# Patient Record
Sex: Female | Born: 1935 | Race: White | Hispanic: No | Smoking: Never smoker
Health system: Southern US, Community
[De-identification: ages and names within clinical notes are randomized; demographics above are authoritative.]

## PROBLEM LIST (undated history)

## (undated) DIAGNOSIS — R32 Unspecified urinary incontinence: Secondary | ICD-10-CM

## (undated) DIAGNOSIS — G20A1 Parkinson's disease without dyskinesia, without mention of fluctuations: Secondary | ICD-10-CM

## (undated) DIAGNOSIS — M2550 Pain in unspecified joint: Secondary | ICD-10-CM

## (undated) DIAGNOSIS — I10 Essential (primary) hypertension: Secondary | ICD-10-CM

## (undated) DIAGNOSIS — G8929 Other chronic pain: Secondary | ICD-10-CM

## (undated) DIAGNOSIS — D649 Anemia, unspecified: Secondary | ICD-10-CM

## (undated) DIAGNOSIS — E039 Hypothyroidism, unspecified: Secondary | ICD-10-CM

## (undated) DIAGNOSIS — M797 Fibromyalgia: Secondary | ICD-10-CM

## (undated) DIAGNOSIS — I639 Cerebral infarction, unspecified: Secondary | ICD-10-CM

## (undated) DIAGNOSIS — G20 Parkinson's disease: Secondary | ICD-10-CM

## (undated) DIAGNOSIS — R413 Other amnesia: Secondary | ICD-10-CM

## (undated) DIAGNOSIS — I2510 Atherosclerotic heart disease of native coronary artery without angina pectoris: Secondary | ICD-10-CM

## (undated) DIAGNOSIS — M1990 Unspecified osteoarthritis, unspecified site: Secondary | ICD-10-CM

## (undated) DIAGNOSIS — Z8719 Personal history of other diseases of the digestive system: Secondary | ICD-10-CM

## (undated) DIAGNOSIS — Z87440 Personal history of urinary (tract) infections: Secondary | ICD-10-CM

## (undated) HISTORY — PX: EYE SURGERY: SHX253

## (undated) HISTORY — PX: ABDOMINAL HYSTERECTOMY: SHX81

## (undated) HISTORY — PX: OTHER SURGICAL HISTORY: SHX169

## (undated) HISTORY — PX: JOINT REPLACEMENT: SHX530

---

## 1998-04-23 ENCOUNTER — Other Ambulatory Visit: Admission: RE | Admit: 1998-04-23 | Discharge: 1998-04-23 | Payer: Self-pay | Admitting: Otolaryngology

## 1998-06-26 ENCOUNTER — Inpatient Hospital Stay (HOSPITAL_COMMUNITY): Admission: RE | Admit: 1998-06-26 | Discharge: 1998-06-30 | Payer: Self-pay | Admitting: Orthopedic Surgery

## 1998-06-30 ENCOUNTER — Inpatient Hospital Stay (HOSPITAL_COMMUNITY)
Admission: RE | Admit: 1998-06-30 | Discharge: 1998-07-07 | Payer: Self-pay | Admitting: Physical Medicine and Rehabilitation

## 1998-12-02 ENCOUNTER — Inpatient Hospital Stay (HOSPITAL_COMMUNITY): Admission: RE | Admit: 1998-12-02 | Discharge: 1998-12-05 | Payer: Self-pay | Admitting: *Deleted

## 1998-12-03 ENCOUNTER — Encounter: Payer: Self-pay | Admitting: *Deleted

## 1998-12-04 ENCOUNTER — Encounter: Payer: Self-pay | Admitting: *Deleted

## 1999-06-28 ENCOUNTER — Observation Stay (HOSPITAL_COMMUNITY): Admission: RE | Admit: 1999-06-28 | Discharge: 1999-06-29 | Payer: Self-pay | Admitting: Urology

## 1999-08-26 ENCOUNTER — Ambulatory Visit (HOSPITAL_COMMUNITY): Admission: RE | Admit: 1999-08-26 | Discharge: 1999-08-26 | Payer: Self-pay | Admitting: *Deleted

## 2002-08-05 ENCOUNTER — Encounter: Payer: Self-pay | Admitting: Neurology

## 2002-08-05 ENCOUNTER — Ambulatory Visit (HOSPITAL_COMMUNITY): Admission: RE | Admit: 2002-08-05 | Discharge: 2002-08-05 | Payer: Self-pay | Admitting: Neurology

## 2003-03-25 ENCOUNTER — Encounter: Admission: RE | Admit: 2003-03-25 | Discharge: 2003-03-25 | Payer: Self-pay

## 2003-04-11 ENCOUNTER — Encounter (INDEPENDENT_AMBULATORY_CARE_PROVIDER_SITE_OTHER): Payer: Self-pay | Admitting: Specialist

## 2003-04-11 ENCOUNTER — Encounter: Payer: Self-pay | Admitting: *Deleted

## 2003-04-11 ENCOUNTER — Ambulatory Visit (HOSPITAL_COMMUNITY): Admission: RE | Admit: 2003-04-11 | Discharge: 2003-04-11 | Payer: Self-pay | Admitting: *Deleted

## 2003-04-14 ENCOUNTER — Encounter: Payer: Self-pay | Admitting: *Deleted

## 2003-04-14 ENCOUNTER — Ambulatory Visit (HOSPITAL_COMMUNITY): Admission: RE | Admit: 2003-04-14 | Discharge: 2003-04-14 | Payer: Self-pay | Admitting: *Deleted

## 2003-06-10 ENCOUNTER — Ambulatory Visit (HOSPITAL_COMMUNITY): Admission: RE | Admit: 2003-06-10 | Discharge: 2003-06-10 | Payer: Self-pay | Admitting: *Deleted

## 2003-06-10 ENCOUNTER — Encounter: Payer: Self-pay | Admitting: *Deleted

## 2005-06-07 ENCOUNTER — Inpatient Hospital Stay (HOSPITAL_COMMUNITY): Admission: RE | Admit: 2005-06-07 | Discharge: 2005-06-12 | Payer: Self-pay | Admitting: Neurosurgery

## 2006-05-25 ENCOUNTER — Encounter: Admission: RE | Admit: 2006-05-25 | Discharge: 2006-05-25 | Payer: Self-pay

## 2006-06-15 ENCOUNTER — Encounter: Admission: RE | Admit: 2006-06-15 | Discharge: 2006-06-15 | Payer: Self-pay

## 2006-07-06 ENCOUNTER — Encounter: Admission: RE | Admit: 2006-07-06 | Discharge: 2006-07-06 | Payer: Self-pay

## 2006-09-27 ENCOUNTER — Encounter
Admission: RE | Admit: 2006-09-27 | Discharge: 2006-09-27 | Payer: Self-pay | Admitting: Physical Medicine and Rehabilitation

## 2006-11-15 ENCOUNTER — Ambulatory Visit (HOSPITAL_COMMUNITY)
Admission: RE | Admit: 2006-11-15 | Discharge: 2006-11-15 | Payer: Self-pay | Admitting: Physical Medicine and Rehabilitation

## 2007-03-22 ENCOUNTER — Encounter
Admission: RE | Admit: 2007-03-22 | Discharge: 2007-03-22 | Payer: Self-pay | Admitting: Physical Medicine and Rehabilitation

## 2007-03-29 ENCOUNTER — Encounter
Admission: RE | Admit: 2007-03-29 | Discharge: 2007-03-29 | Payer: Self-pay | Admitting: Physical Medicine and Rehabilitation

## 2008-01-23 ENCOUNTER — Encounter
Admission: RE | Admit: 2008-01-23 | Discharge: 2008-01-23 | Payer: Self-pay | Admitting: Physical Medicine and Rehabilitation

## 2009-11-18 ENCOUNTER — Emergency Department (HOSPITAL_COMMUNITY): Admission: EM | Admit: 2009-11-18 | Discharge: 2009-11-18 | Payer: Self-pay | Admitting: Emergency Medicine

## 2010-03-10 IMAGING — CT CT HEAD W/O CM
1 series · 16 of 28 positions shown, 20 images · non-contrast
Comparison: None

CLINICAL DATA: Headache.

CT HEAD WITHOUT CONTRAST
TECHNIQUE: Contiguous axial images were obtained from the base of
the skull through the vertex without contrast.

[Series 2: brain · axial · 0.47mm/px · z∈[+150,+285]mm · 16 of 28 slices shown, 20 images]
[im 2/28  brain]
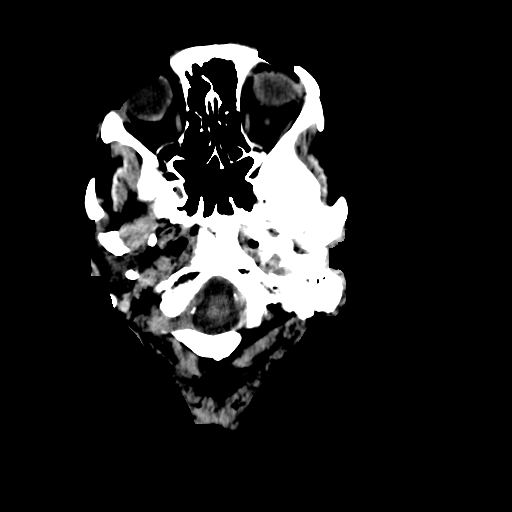
[im 2/28  bone]
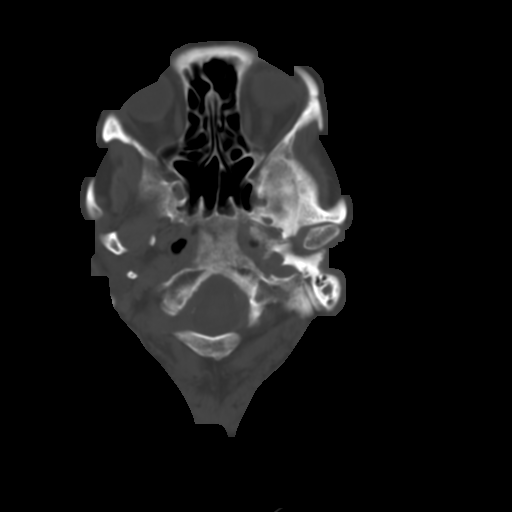
[im 4/28  brain]
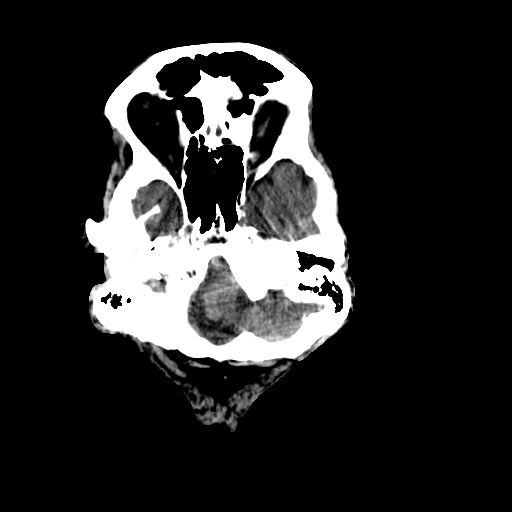
[im 6/28  brain]
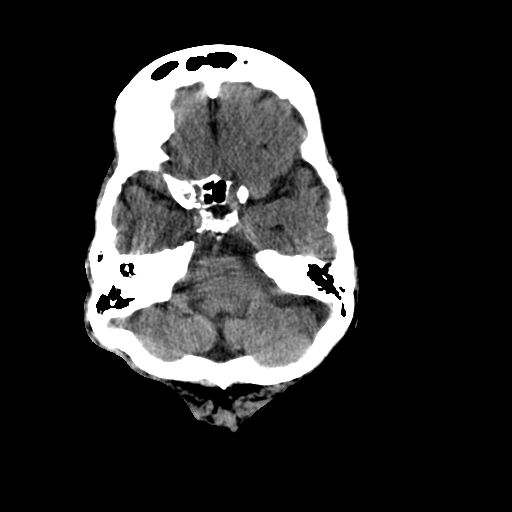
[im 7/28  brain]
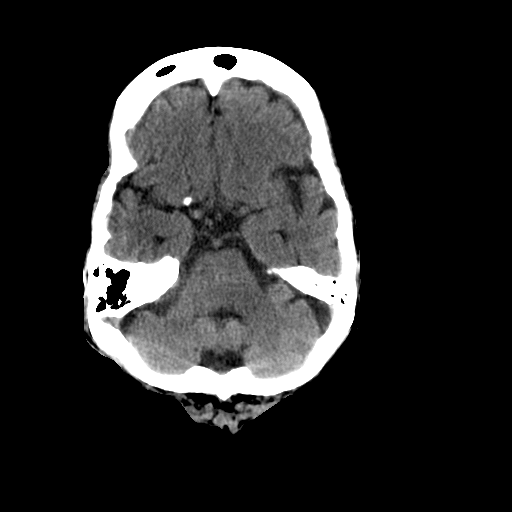
[im 9/28  brain]
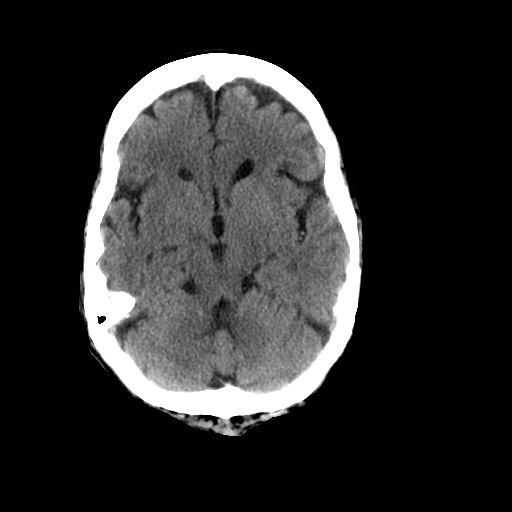
[im 9/28  bone]
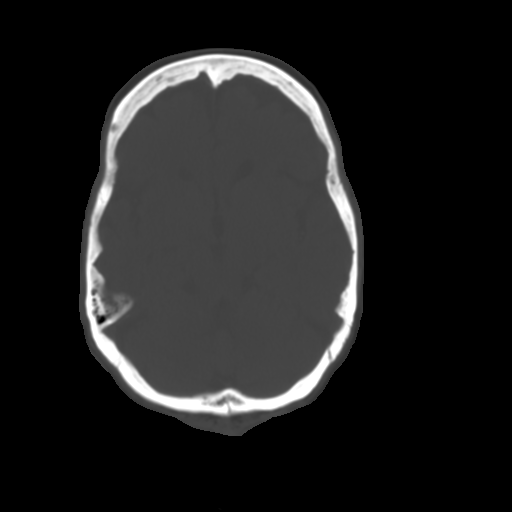
[im 10/28  brain]
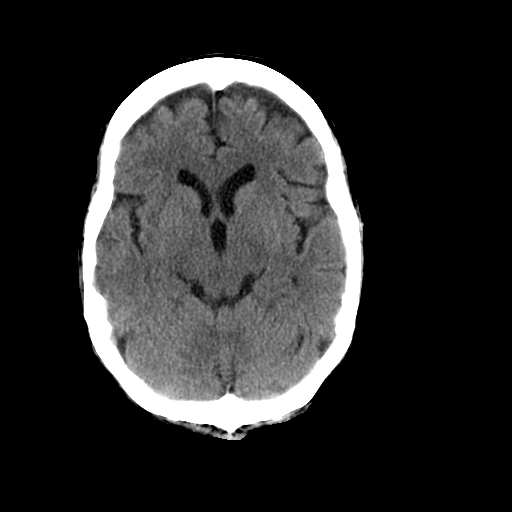
[im 12/28  brain]
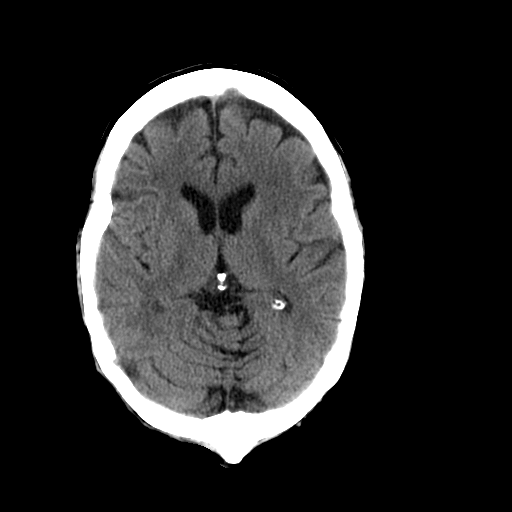
[im 14/28  brain]
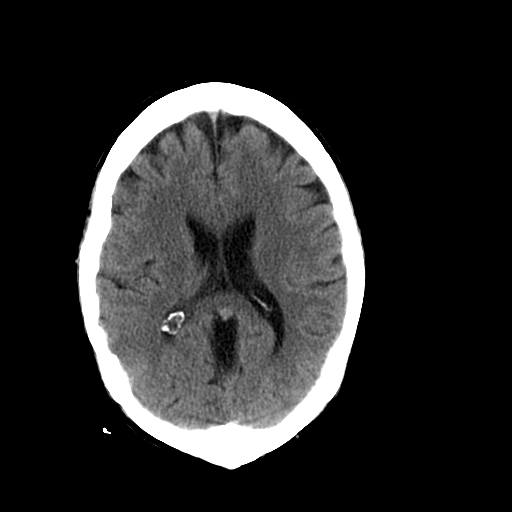
[im 15/28  brain]
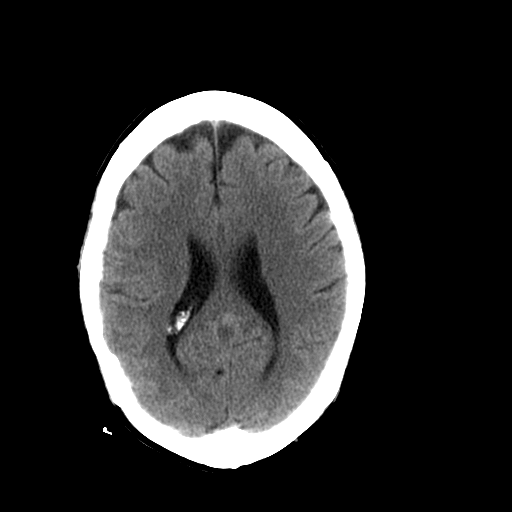
[im 15/28  bone]
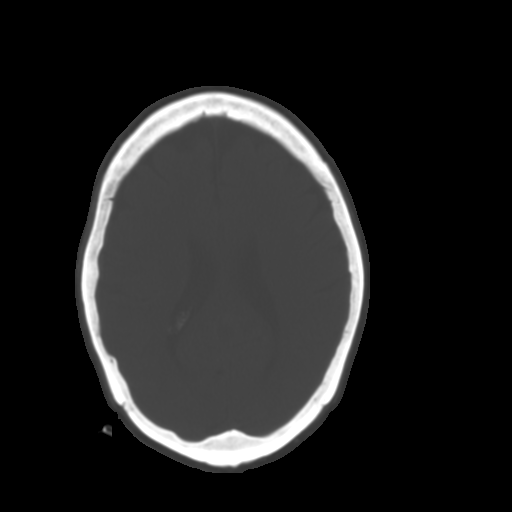
[im 17/28  brain]
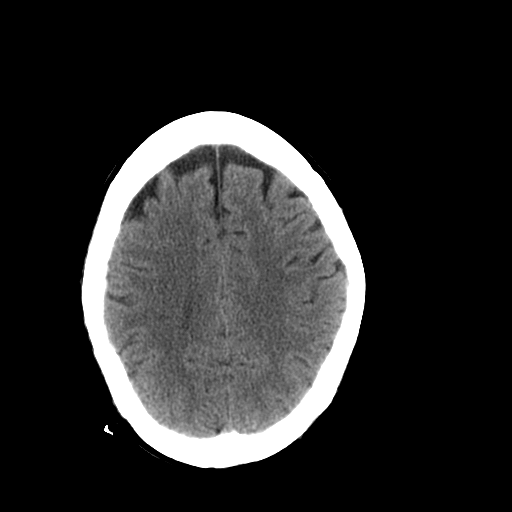
[im 19/28  brain]
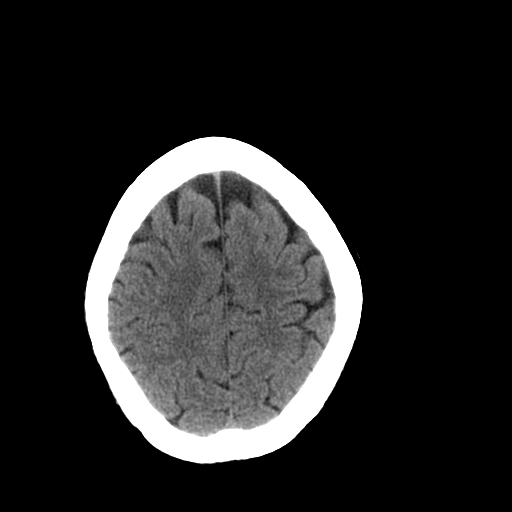
[im 20/28  brain]
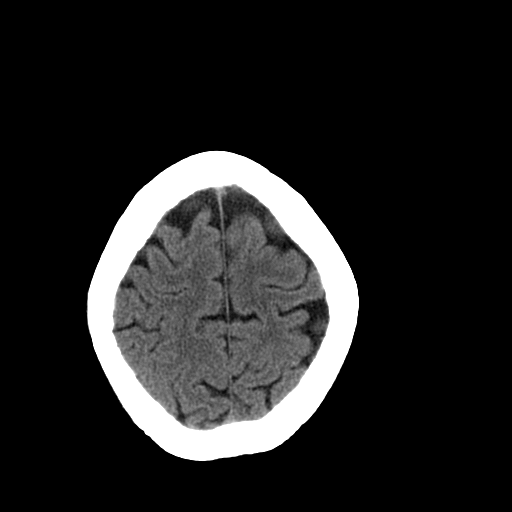
[im 22/28  brain]
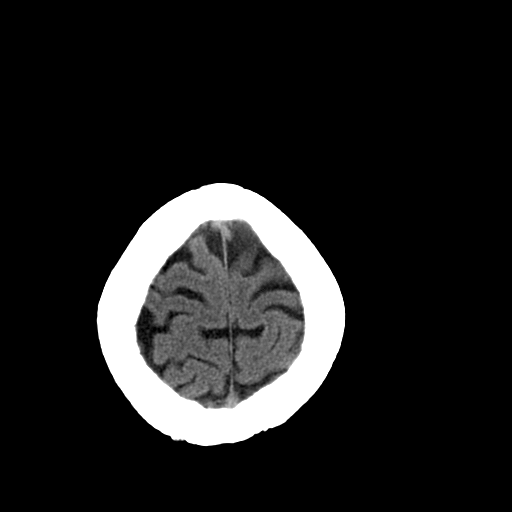
[im 22/28  bone]
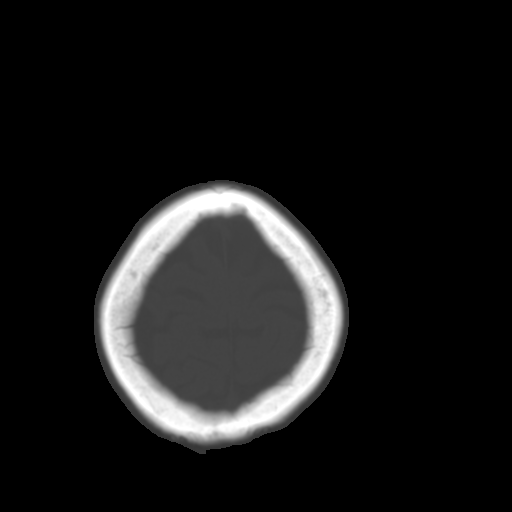
[im 23/28  brain]
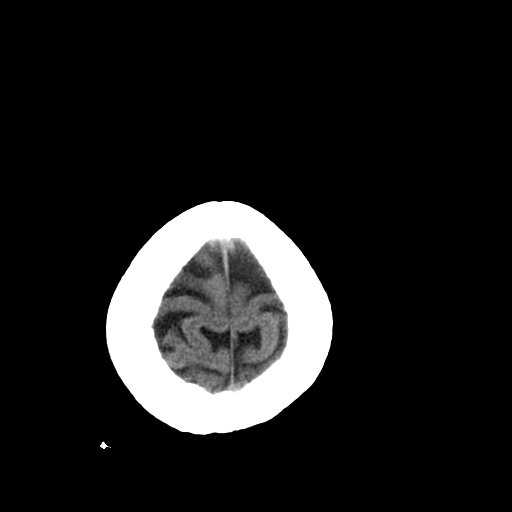
[im 25/28  brain]
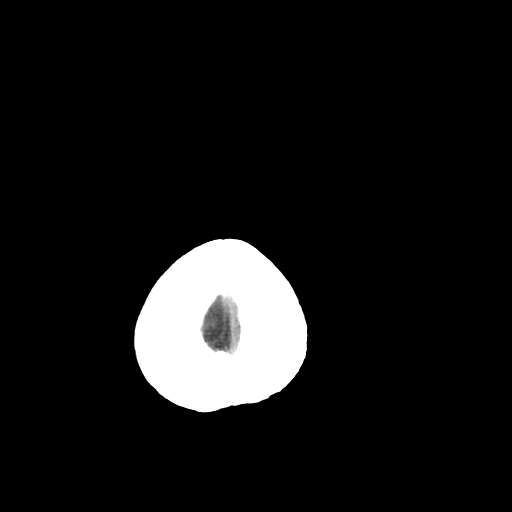
[im 27/28  brain]
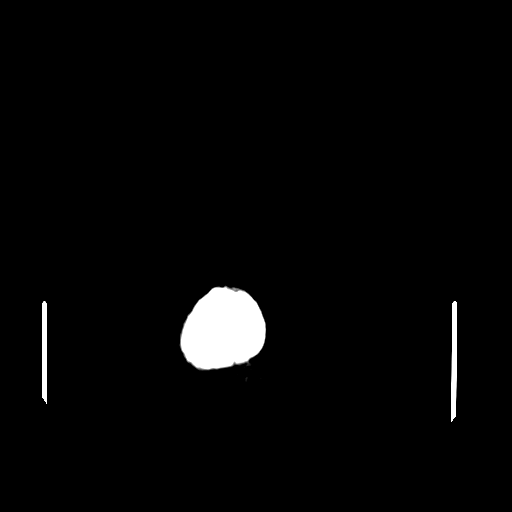

[16 of 28 positions shown; findings below may reference images not displayed]

FINDINGS: There is mild atrophy. No acute intracranial abnormality.
Specifically, no hemorrhage, hydrocephalus, mass lesion, acute
infarction, or significant intracranial injury.  No acute calvarial
abnormality.

Visualized paranasal sinuses and mastoids clear.  Orbital soft
tissues unremarkable.
IMPRESSION: No acute intracranial abnormality.

## 2010-12-12 ENCOUNTER — Encounter: Payer: Self-pay | Admitting: Physical Medicine and Rehabilitation

## 2011-02-21 LAB — URINE MICROSCOPIC-ADD ON

## 2011-02-21 LAB — COMPREHENSIVE METABOLIC PANEL
Alkaline Phosphatase: 59 U/L (ref 39–117)
BUN: 20 mg/dL (ref 6–23)
Chloride: 93 mEq/L — ABNORMAL LOW (ref 96–112)
Creatinine, Ser: 1.27 mg/dL — ABNORMAL HIGH (ref 0.4–1.2)
GFR calc non Af Amer: 41 mL/min — ABNORMAL LOW (ref >60)
Glucose, Bld: 127 mg/dL — ABNORMAL HIGH (ref 70–99)
Potassium: 3.5 mEq/L (ref 3.5–5.1)
Total Bilirubin: 0.5 mg/dL (ref 0.3–1.2)

## 2011-02-21 LAB — URINALYSIS, ROUTINE W REFLEX MICROSCOPIC
Glucose, UA: NEGATIVE mg/dL
Hgb urine dipstick: NEGATIVE
Specific Gravity, Urine: 1.011 (ref 1.005–1.030)
Urobilinogen, UA: 0.2 mg/dL (ref 0.0–1.0)

## 2011-02-21 LAB — CBC
HCT: 38.6 % (ref 36.0–46.0)
Hemoglobin: 13.1 g/dL (ref 12.0–15.0)
MCV: 102 fL — ABNORMAL HIGH (ref 78.0–100.0)
WBC: 9 10*3/uL (ref 4.0–10.5)

## 2011-02-21 LAB — URINE CULTURE

## 2011-02-21 LAB — DIFFERENTIAL
Basophils Absolute: 0 10*3/uL (ref 0.0–0.1)
Basophils Relative: 0 % (ref 0–1)
Lymphocytes Relative: 9 % — ABNORMAL LOW (ref 12–46)
Neutro Abs: 7.8 10*3/uL — ABNORMAL HIGH (ref 1.7–7.7)
Neutrophils Relative %: 87 % — ABNORMAL HIGH (ref 43–77)

## 2011-04-08 NOTE — Op Note (Signed)
   NAME:  Cristina Farrell, Cristina Farrell                           ACCOUNT NO.:  [id number]   MEDICAL RECORD NO.:  [id number]                   PATIENT TYPE:  AMB   LOCATION:  ENDO                                 FACILITY:  [facility]   PHYSICIAN:  Georgiana Spinner, M.D.                 DATE OF BIRTH:  1929/06/07   DATE OF PROCEDURE:  DATE OF DISCHARGE:                                 OPERATIVE REPORT   PROCEDURE:  Upper endoscopy.   INDICATIONS:  Weight loss, hemoccult positivity.   ANESTHESIA:  Demerol 60 mg, Versed 5 mg.   DESCRIPTION OF PROCEDURE:  With the patient mildly sedated in the left  lateral decubitus position, the Olympus videoscopic endoscope was inserted  in the mouth and passed under direct vision through the esophagus, which  appeared normal, into the stomach.  The  fundus, body and antrum were  visualized as was the duodenal bulb and second portion of the duodenum.  From this point, the endoscope was slowly withdrawn taking circumferential  views of the duodenal mucosa until the endoscope then pulled back into the  stomach, placed in retroflexion and viewed the stomach from below.  The  endoscope was then straightened, withdrawn, taking circumferential views of  the remaining gastric and esophageal mucosa, stopping only in the fundus and  body of the stomach to biopsy the erythema seen.  The patient's vital signs  and pulse oximetry remained stable.  The patient tolerated the procedure  well without apparent complication.   FINDINGS:  Changes of gastritis with blood flecks in the stomach, biopsied.  Await biopsy report.  The patient will call me for results and follow up  with me as an outpatient.  Proceed with colonoscopy as planned.                                                Georgiana Spinner, M.D.    GMO/MEDQ  D:  04/11/2003  T:  04/11/2003  Job:  [id number]

## 2011-04-08 NOTE — Op Note (Signed)
Cristina Farrell, Cristina Farrell NO.:  [id number]   MEDICAL RECORD NO.:  [id number]          PATIENT TYPE:  INP   LOCATION:  2899                         FACILITY:  [facility]   PHYSICIAN:  Hilda Lias, M.D.   DATE OF BIRTH:  03-Jul-1929   DATE OF PROCEDURE:  06/07/2005  DATE OF DISCHARGE:                                 OPERATIVE REPORT   PREOPERATIVE DIAGNOSIS:  Right L3-L4 extraforaminal herniated disc with  facet hypertrophy, stenosis, scoliosis, osteoporosis, chronic intake of  prednisone.   POSTOPERATIVE DIAGNOSIS:  Right L3-L4 extraforaminal herniated disc with  facet hypertrophy, stenosis, scoliosis, osteoporosis, chronic intake of  prednisone.   PROCEDURE:  Right L3-L4 laminotomy, foraminotomy to decompress the L3-L4  nerve root, right L3-L4 extraforaminal discectomy, decompression of the L3  nerve root, posterolateral arthrodesis with autograft and BMP on the left  side from L2 to L4, spiral plate from lamina of L2 to L4, microscope.   SURGEON:  Hilda Lias, M.D.   ASSISTANT:  Hewitt Shorts, M.D.   CLINICAL HISTORY:  The patient is being admitted because of back pain  radiating to the right leg.  The pain goes to the right knee but not below  the knee.  The patient has a severe case of osteoporosis and scoliosis.  MRI  showed herniated disc plus hypertrophy of the facet at the level of 3-4.  Surgery was advised.  The patient knew about using the plate as well as the  interbody fusion.  The risks were explained during the history and physical.   PROCEDURE:  The patient was taken to the OR and she was positioned in a  prone manner.  The back was sprayed with Betadine.  A midline incision from  the lower part of L2 down to the upper part of L4 was made.  The muscles  were retracted laterally and bilateral.  With the x-ray, we identified that,  indeed, we were at the level of 3-4.  Then, we brought the microscope into  the area and with the drill, we  drilled the lamina of L3 and L4.  Then, we  identified the L3-L4 extraforaminal space and with the drill, we drilled  part of the overgrowth of facet until we found the L3 nerve root which was  swollen and there was a disc not only anteriorly but also dorsally.  Decompression of the L3 nerve root was done.  We entered the disc space and  it was quite narrow.  Trying to go from the intraforaminal to foraminal, it  was difficult to go beyond the first 0.5 cm from the disc.  Because of that,  we decided not to go ahead with interbody fusion.  Having good decompression  of the L3-L4 nerve root, we went to the left side and we drilled the lamina  of the facet of L5, L3, and L4, and we used a mix of autograft as well as  BNP.  Then, a spiral plate from L2 to L4 to attach to the spinous process  was used.  At the end, we had a good, solid  fusion.  From then on, the area  was irrigated.  Fentanyl was left in the epidural space.  The wound was  closed with Vicryl and Steri-Strips.       EB/MEDQ  D:  06/07/2005  T:  06/07/2005  Job:  [id number]

## 2011-04-08 NOTE — Op Note (Signed)
   NAME:  Cristina Farrell, Cristina Farrell                           ACCOUNT NO.:  [id number]   MEDICAL RECORD NO.:  [id number]                   PATIENT TYPE:  AMB   LOCATION:  ENDO                                 [facility]AC[facility]   PHYSICIAN:  Georgiana Spinner, M.D.                 DATE OF BIRTH:  [birth year]   DATE OF PROCEDURE:  DATE OF DISCHARGE:                                 OPERATIVE REPORT   PROCEDURE:  Colonoscopy.   INDICATIONS:  Weight loss, hemoccult positivity which may be due to the  blood seen in the stomach.   ANESTHESIA:  Demerol 30, Versed 3 mg additionally.   DESCRIPTION OF PROCEDURE:  With the patient mildly sedated and in the left  lateral decubitus position, the Olympus videoscopic variable stiffness  colonoscope was inserted in the rectum and passed under direct vision to the  right colon.  It appeared we were in the ascending colon just above the  cecum but with pressure applied and the patient rolled to various positions,  we got to a position where the endoscope would not advance any further.  Therefore, the endoscope was withdrawn taking circumferential views of the  remaining colonic mucosa stopping then only in the rectum which appeared  normal and showed small hemorrhoids on retroflexed view.  The endoscope was  straightened and withdrawn.  The patient's vital signs and pulse oximetry  remained stable.  The patient tolerated the procedure well without apparent  complications.   FINDINGS:  Limited examination due to prep.  There were multiple areas of  brownish, thick liquid that had to be cleared.  Additionally we could not  reach the cecum because of tortuosity and length of the colon.   PLAN:  Air contrast barium enema and followup will be with me subsequently  as an outpatient.  See endoscopy note for further details as well.                                               Georgiana Spinner, M.D.    GMO/MEDQ  D:  04/11/2003  T:  04/11/2003  Job:  [id number]

## 2011-04-08 NOTE — Discharge Summary (Signed)
NAME:  Cristina Farrell, Cristina Farrell NO.:  [id number]   MEDICAL RECORD NO.:  [id number]          PATIENT TYPE:  INP   LOCATION:  3003                         FACILITY:  [facility]   PHYSICIAN:  Hewitt Shorts, M.D.DATE OF BIRTH:  [birth year]   DATE OF ADMISSION:  06/07/2005  DATE OF DISCHARGE:  06/12/2005                                 DISCHARGE SUMMARY   ADMISSION HISTORY AND PHYSICAL EXAMINATION:  The patient is a 77 year old  woman under the care of Dr. Jeral Fruit, who has had back and leg pain.  Dr.  Jeral Fruit described that her MRI showed evidence of lumbar stenosis and  admitted her for surgical decompression.  Details of his admission history  and physical examination are  included in his admission notes.   HOSPITAL COURSE:  The patient was admitted and underwent a right L3-4  decompression and arthrodesis.  She has done well following surgery.  She  was seen in consultation by physical therapy, but at this point is up and  ambulating actively in the halls.  Her wound is healing well. She is  afebrile and is asking to be discharged home.  We have gone ahead and given  her instructions for activities and wound care. She is to return to see Dr.  Jeral Fruit in three weeks for follow-up with AP lateral lumbar spine x-ray. She  is given prescription for Percocet 1-2 tablets p.o. q.4-6h. p.r.n. pain (40  tablets no refills).   DISCHARGE DIAGNOSIS:  1.  Lumbar spondylosis.  2.  Degenerative disk disease with stenosis.       RWN/MEDQ  D:  06/12/2005  T:  06/12/2005  Job:  [id number]

## 2014-10-21 DIAGNOSIS — Z87440 Personal history of urinary (tract) infections: Secondary | ICD-10-CM

## 2014-10-21 HISTORY — DX: Personal history of urinary (tract) infections: Z87.440

## 2015-01-09 ENCOUNTER — Other Ambulatory Visit: Payer: Self-pay | Admitting: Urology

## 2015-01-23 ENCOUNTER — Encounter (HOSPITAL_COMMUNITY): Payer: Self-pay

## 2015-01-23 ENCOUNTER — Other Ambulatory Visit: Payer: Self-pay

## 2015-01-23 ENCOUNTER — Encounter (HOSPITAL_COMMUNITY)
Admission: RE | Admit: 2015-01-23 | Discharge: 2015-01-23 | Disposition: A | Discharge status: Home / Self Care | Payer: Medicare Other | Source: Ambulatory Visit | Attending: Urology | Admitting: Urology

## 2015-01-23 DIAGNOSIS — Y838 Other surgical procedures as the cause of abnormal reaction of the patient, or of later complication, without mention of misadventure at the time of the procedure: Secondary | ICD-10-CM | POA: Insufficient documentation

## 2015-01-23 DIAGNOSIS — Z01810 Encounter for preprocedural cardiovascular examination: Secondary | ICD-10-CM | POA: Insufficient documentation

## 2015-01-23 DIAGNOSIS — Z01818 Encounter for other preprocedural examination: Secondary | ICD-10-CM | POA: Diagnosis not present

## 2015-01-23 DIAGNOSIS — T8589XA Other specified complication of internal prosthetic devices, implants and grafts, not elsewhere classified, initial encounter: Secondary | ICD-10-CM | POA: Diagnosis not present

## 2015-01-23 DIAGNOSIS — Z01812 Encounter for preprocedural laboratory examination: Secondary | ICD-10-CM | POA: Insufficient documentation

## 2015-01-23 HISTORY — DX: Unspecified osteoarthritis, unspecified site: M19.90

## 2015-01-23 HISTORY — DX: Personal history of other diseases of the digestive system: Z87.19

## 2015-01-23 HISTORY — DX: Essential (primary) hypertension: I10

## 2015-01-23 HISTORY — DX: Cerebral infarction, unspecified: I63.9

## 2015-01-23 HISTORY — DX: Anemia, unspecified: D64.9

## 2015-01-23 HISTORY — DX: Personal history of urinary (tract) infections: Z87.440

## 2015-01-23 HISTORY — DX: Atherosclerotic heart disease of native coronary artery without angina pectoris: I25.10

## 2015-01-23 HISTORY — DX: Other chronic pain: G89.29

## 2015-01-23 HISTORY — DX: Hypothyroidism, unspecified: E03.9

## 2015-01-23 HISTORY — DX: Other amnesia: R41.3

## 2015-01-23 HISTORY — DX: Fibromyalgia: M79.7

## 2015-01-23 HISTORY — DX: Pain in unspecified joint: M25.50

## 2015-01-23 HISTORY — DX: Parkinson's disease: G20

## 2015-01-23 HISTORY — DX: Unspecified urinary incontinence: R32

## 2015-01-23 HISTORY — DX: Parkinson's disease without dyskinesia, without mention of fluctuations: G20.A1

## 2015-01-23 LAB — CBC
HCT: 34.1 % — ABNORMAL LOW (ref 36.0–46.0)
Hemoglobin: 10.2 g/dL — ABNORMAL LOW (ref 12.0–15.0)
MCH: 30 pg (ref 26.0–34.0)
MCHC: 29.9 g/dL — ABNORMAL LOW (ref 30.0–36.0)
MCV: 100.3 fL — ABNORMAL HIGH (ref 78.0–100.0)
Platelets: 387 10*3/uL (ref 150–400)
RBC: 3.4 MIL/uL — AB (ref 3.87–5.11)
RDW: 14.8 % (ref 11.5–15.5)
WBC: 13.8 10*3/uL — ABNORMAL HIGH (ref 4.0–10.5)

## 2015-01-23 LAB — BASIC METABOLIC PANEL
Anion gap: 10 (ref 5–15)
BUN: 15 mg/dL (ref 6–23)
CALCIUM: 8.7 mg/dL (ref 8.4–10.5)
CO2: 32 mmol/L (ref 19–32)
CREATININE: 1.01 mg/dL (ref 0.50–1.10)
Chloride: 98 mmol/L (ref 96–112)
GFR calc Af Amer: 58 mL/min — ABNORMAL LOW (ref >90)
GFR calc non Af Amer: 50 mL/min — ABNORMAL LOW (ref >90)
GLUCOSE: 241 mg/dL — AB (ref 70–99)
Potassium: 4.5 mmol/L (ref 3.5–5.1)
Sodium: 140 mmol/L (ref 135–145)

## 2015-01-23 NOTE — Progress Notes (Signed)
Called office of Dr Ardelle [phone]- [phone].  They have no old EKG on file.  They do have ECHO- 2012 which they will fax along with office visit note  Close to approximate date of ECHO.  They will fax that also.  They do not have on file who prior PCP was.  Called and left messages for daughter- Boris Sharpernna Holler on home and cell phones asking who PCP was prior to Dr Ardelle ParkHaque to obtain old EKG if possible.  [facility] [place]- [phone] to find out who PCP was prior to Dr Ardelle ParkHaque to obtain EKG.

## 2015-01-23 NOTE — Patient Instructions (Addendum)
Cristina RecordsFay R Farrell  01/23/2015   Your procedure is scheduled on: Monday February 02, 2015  [facility] and follow signs to               Short Stay Center at 900 am.  Call this number if you have problems the morning of surgery [phone]   Remember: PLEASE SEND LATEST COPY OF MEDICATION RECORD AND LAST TIME PATIENT ATE AND DRANK WITH PATIENT DAY OF SURGERY!  Do not eat food or drink liquids :After Midnight.     Take these medicines the morning of surgery with A SIP OF WATER: Amlodipine, Artificial tears, Duloxetine, Levothryroxine, Lidocaine patch, Metorpolol Succ ER, Omeprazole, Prednisone                               You may not have any metal on your body including hair pins and              piercings  Do not wear jewelry, make-up, lotions, powders or perfumes.             Do not wear nail polish.  Do not shave  48 hours prior to surgery.              Men may shave face and neck.   Do not bring valuables to the hospital. [facility] IS NOT             RESPONSIBLE   FOR VALUABLES.  Contacts, dentures or bridgework may not be worn into surgery.  Leave suitcase in the car. After surgery it may be brought to your room.     Patients discharged the day of surgery will not be allowed to drive home.  Name and phone number of your driver:  Special Instructions: N/A              Please read over the following fact sheets you were given: _____________________________________________________________________             [facility] - Preparing for Surgery Before surgery, you can play an important role.  Because skin is not sterile, your skin needs to be as free of germs as possible.  You can reduce the number of germs on your skin by washing with CHG (chlorahexidine gluconate) soap before surgery.  CHG is an antiseptic cleaner which kills germs and bonds with the skin to continue killing germs even after washing. Please DO NOT use if you have an  allergy to CHG or antibacterial soaps.  If your skin becomes reddened/irritated stop using the CHG and inform your nurse when you arrive at Short Stay. Do not shave (including legs and underarms) for at least 48 hours prior to the first CHG shower.  You may shave your face/neck. Please follow these instructions carefully:  1.  Shower with CHG Soap the night before surgery and the  morning of Surgery.  2.  If you choose to wash your hair, wash your hair first as usual with your  normal  shampoo.  3.  After you shampoo, rinse your hair and body thoroughly to remove the  shampoo.                           4.  Use CHG as you would any other liquid soap.  You can  apply chg directly  to the skin and wash                       Gently with a scrungie or clean washcloth.  5.  Apply the CHG Soap to your body ONLY FROM THE NECK DOWN.   Do not use on face/ open                           Wound or open sores. Avoid contact with eyes, ears mouth and genitals (private parts).                       Wash face,  Genitals (private parts) with your normal soap.             6.  Wash thoroughly, paying special attention to the area where your surgery  will be performed.  7.  Thoroughly rinse your body with warm water from the neck down.  8.  DO NOT shower/wash with your normal soap after using and rinsing off  the CHG Soap.                9.  Pat yourself dry with a clean towel.            10.  Wear clean pajamas.            11.  Place clean sheets on your bed the night of your first shower and do not  sleep with pets. Day of Surgery : Do not apply any lotions/deodorants the morning of surgery.  Please wear clean clothes to the hospital/surgery center.  FAILURE TO FOLLOW THESE INSTRUCTIONS MAY RESULT IN THE CANCELLATION OF YOUR SURGERY PATIENT SIGNATURE_________________________________  NURSE SIGNATURE__________________________________  ________________________________________________________________________

## 2015-01-23 NOTE — Progress Notes (Signed)
Daughter returned call and stated prior PCP to Dr Ardelle ParkHaque was Dr [place].  [place] [facility] [place] and asked them to fax most recent EKG available.  Faxed to them records request.  Received back from them EKG from 2011.  Also received ECHO and office visit note of 2012 from Dr Ardelle ParkHaque.  All info of current EKG done 01/23/15 , EKG from 2011, ECHO and office visit note shown to Dr  GroutPeter Carignan. No new orders given.

## 2015-01-23 NOTE — Progress Notes (Addendum)
CALLED [facility] AND LEFT MESSAGE IN MED ROOM PRE OP INSTRUCTIONS FAXED TO [phone] AND COPY SENT WITH PATIENT DAUGHTER ANNA HOLLERS BACK TO FACILITY TODAY. CALL [phone] IF QUESTION.

## 2015-02-01 NOTE — H&P (Signed)
Active Problems Problems  1. Vaginal bleeding (N93.9)  History of Present Illness Cristina Farrell is and 81 yo female, resident of [facility] in [place], now 20+ years post pubovaginal sling. She has developed vaginal bleeding, and has been found by Dr. Leota Jacobsen to have vaginal suture erosion, and referred for Rx. she is c/o suprapubic pain.. Normal bowel movements. urine control: sometimes she has incontinence without awareness.   Past Medical History Problems  1. History of Dementia (F03.90) 2. History of arthritis (Z87.39) 3. History of esophageal reflux (Z87.19) 4. History of fibromyalgia (Z87.39) 5. History of glaucoma (Z86.69) 6. History of hypertension (Z86.79) 7. History of hypothyroidism (Z86.39) 8. History of Parkinson disease, symptomatic (G20)  Surgical History Problems  1. History of Ankle Surgery 2. History of Hernia Repair 3. History of Knee Replacement 4. History of Retropubic Urethral Suspension 5. History of Total Abdominal Hysterectomy 6. History of Total Hip Replacement  Current Meds 1. Amitriptyline HCl - 75 MG Oral Tablet;  Therapy: (Recorded:16Feb2016) to Recorded 2. AmLODIPine Besylate 5 MG Oral Tablet;  Therapy: (Recorded:16Feb2016) to Recorded 3. Artificial Tears SOLN;  Therapy: (Recorded:16Feb2016) to Recorded 4. Aspirin 325 MG Oral Tablet;  Therapy: (Recorded:16Feb2016) to Recorded 5. Atorvastatin Calcium 10 MG Oral Tablet;  Therapy: (Recorded:16Feb2016) to Recorded 6. DiphenhydrAMINE HCl - 25 MG Oral Capsule; TAKE CAPSULE  PRN;  Therapy: (Recorded:16Feb2016) to Recorded 7. Docusate Sodium 100 MG Oral Tablet;  Therapy: (Recorded:16Feb2016) to Recorded 8. DULoxetine HCl - 60 MG Oral Capsule Delayed Release Particles;  Therapy: (Recorded:16Feb2016) to Recorded 9. Ferralet 90 TABS;  Therapy: (Recorded:16Feb2016) to Recorded 10. Imodium A-D 2 MG Oral Tablet; TAKE TABLET  PRN;   Therapy: (Recorded:16Feb2016) to Recorded 11.  Ketorolac Tromethamine 10 MG Oral Tablet; TAKE TABLET  PRN;   Therapy: (Recorded:16Feb2016) to Recorded 12. Levothyroxine Sodium 112 MCG Oral Tablet;   Therapy: (Recorded:16Feb2016) to Recorded 13. Lidocaine 5 % External Patch;   Therapy: (Recorded:16Feb2016) to Recorded 14. Lisinopril 5 MG Oral Tablet;   Therapy: (Recorded:16Feb2016) to Recorded 15. Magnesium Oxide 400 MG Oral Tablet;   Therapy: (Recorded:16Feb2016) to Recorded 16. MetFORMIN HCl - 500 MG Oral Tablet;   Therapy: (Recorded:16Feb2016) to Recorded 17. Metoprolol Succinate ER 50 MG Oral Tablet Extended Release 24 Hour;   Therapy: (Recorded:16Feb2016) to Recorded 18. Multi-Vitamin TABS;   Therapy: (Recorded:16Feb2016) to Recorded 19. Nitrostat 0.4 MG Sublingual Tablet Sublingual; PLACE TABLET  PRN;   Therapy: (Recorded:16Feb2016) to Recorded 20. Omeprazole 40 MG Oral Capsule Delayed Release;   Therapy: (Recorded:16Feb2016) to Recorded 21. OxyCODONE HCl - 5 MG Oral Tablet;   Therapy: (Recorded:16Feb2016) to Recorded 22. Oyster Shell Calcium 500 MG Oral Tablet;   Therapy: (Recorded:16Feb2016) to Recorded 23. Polyethylene Glycol 3350 Oral Powder;   Therapy: (Recorded:16Feb2016) to Recorded 24. PredniSONE 5 MG Oral Tablet;   Therapy: (Recorded:16Feb2016) to Recorded 25. PreserVision AREDS CAPS;   Therapy: (Recorded:16Feb2016) to Recorded 26. RisaQuad Oral Capsule;   Therapy: (Recorded:16Feb2016) to Recorded 27. RisperiDONE 0.5 MG Oral Tablet;   Therapy: (Recorded:16Feb2016) to Recorded 28. Senna-Time 8.6 MG Oral Tablet;   Therapy: (Recorded:16Feb2016) to Recorded 29. Sucralfate 1 GM Oral Tablet;   Therapy: (Recorded:16Feb2016) to Recorded 30. TraZODone HCl - 100 MG Oral Tablet; TAKE TABLET  PRN;   Therapy: (Recorded:16Feb2016) to Recorded 31. Tylenol Extra Strength 500 MG Oral Tablet;   Therapy: (Recorded:16Feb2016) to Recorded 32. Vitamin D3 1000 UNIT Oral Tablet;   Therapy: (Recorded:16Feb2016) to  Recorded  Allergies Medication  1. Codeine Derivatives 2. Synthroid TABS  Family History Problems  1. Family  history of diabetes mellitus (Z83.3) : Father 2. Family history of prostate cancer (Z80.42) : Father 3. Family history of Heart trouble : Father  Social History Problems    Denied: History of Alcohol use   Denied: History of Caffeine use   Four children   Never a smoker   Retired   Arts development officerhome maker   Widowed  Vitals Vital Signs [Data Includes: Last 1 Day]  Recorded: 16Feb2016 04:03PM  Height: 5 ft  Weight: 167 lb  BMI Calculated: 32.62 BSA Calculated: 1.73 Blood Pressure: 135 / 66 Temperature: 97.8 F Heart Rate: 81  Physical Exam Constitutional: Well nourished and well developed . No acute distress. The patient appears well hydrated.  ENT:. Examination of the teeth show poor dentition. Hearing loss is noted.  Neck: The appearance of the neck is normal.  Pulmonary: No respiratory distress.  Cardiovascular:. No peripheral edema.  Abdomen: The abdomen is mildly obese, but not distended. The abdomen is soft and nontender. No masses are palpated. No hernias are palpable.  Genitourinary:  Chaperone Present: laura.  Examination of the external genitalia shows vulvar atrophy, but no lesions. The urethra is not tender and no urethral caruncle. There is no urethral mass. Urethral hypermobility is not present. There is no urethral discharge. Sutures palpable. Vaginal exam demonstrates atrophy and the vaginal epithelium to be poorly estrogenized, but no abnormalities, no tenderness, no uterine prolapse and no mass. No cystocele is identified. No enterocele is identified. No rectocele is identified. The cervix is is absent. The bladder is tender, but normal on palpation.  Lymphatics: The femoral and inguinal nodes are not enlarged or tender.  Skin: Normal skin turgor, no visible rash and no visible skin lesions.  Neuro/Psych:. Mood and affect are appropriate.     Results/Data Urine [Data Includes: Last 1 Day]   16Feb2016  COLOR YELLOW   APPEARANCE CLEAR   SPECIFIC GRAVITY 1.010   pH 6.0   GLUCOSE NEG mg/dL  BILIRUBIN NEG   KETONE NEG mg/dL  BLOOD TRACE   PROTEIN NEG mg/dL  UROBILINOGEN 0.2 mg/dL  NITRITE NEG   LEUKOCYTE ESTERASE NEG   SQUAMOUS EPITHELIAL/HPF FEW   WBC NONE SEEN WBC/hpf  RBC 0-2 RBC/hpf  BACTERIA NONE SEEN   CRYSTALS NONE SEEN   CASTS NONE SEEN    Assessment Assessed  1. Vaginal bleeding (N93.9)  20 yrs post urethral surgery ( no notes found) . No mesh used, but sutures palpable. She will have cysto and excision of vaginal sutures. Discussed with patient and daughters.   Plan Health Maintenance  1. UA With REFLEX; [Do Not Release]; Status:Resulted - Requires  Verification;   Done: 16Feb2016 02:16PM  OP suture excision.   Discussion/Summary Dr. Leota JacobsenGaccione, [place]     Signatures

## 2015-02-02 ENCOUNTER — Ambulatory Visit (HOSPITAL_COMMUNITY): Payer: Medicare Other | Admitting: Anesthesiology

## 2015-02-02 ENCOUNTER — Encounter (HOSPITAL_COMMUNITY): Payer: Self-pay | Admitting: Anesthesiology

## 2015-02-02 ENCOUNTER — Encounter (HOSPITAL_COMMUNITY)
Admission: RE | Disposition: A | Discharge status: Home / Self Care | Payer: Self-pay | Source: Ambulatory Visit | Attending: Urology

## 2015-02-02 ENCOUNTER — Ambulatory Visit (HOSPITAL_COMMUNITY)
Admission: RE | Admit: 2015-02-02 | Discharge: 2015-02-02 | Disposition: A | Discharge status: Home / Self Care | Payer: Medicare Other | Source: Ambulatory Visit | Attending: Urology | Admitting: Urology

## 2015-02-02 DIAGNOSIS — I2510 Atherosclerotic heart disease of native coronary artery without angina pectoris: Secondary | ICD-10-CM | POA: Diagnosis not present

## 2015-02-02 DIAGNOSIS — E039 Hypothyroidism, unspecified: Secondary | ICD-10-CM | POA: Insufficient documentation

## 2015-02-02 DIAGNOSIS — T8589XA Other specified complication of internal prosthetic devices, implants and grafts, not elsewhere classified, initial encounter: Secondary | ICD-10-CM | POA: Insufficient documentation

## 2015-02-02 DIAGNOSIS — M1990 Unspecified osteoarthritis, unspecified site: Secondary | ICD-10-CM | POA: Diagnosis not present

## 2015-02-02 DIAGNOSIS — Z7982 Long term (current) use of aspirin: Secondary | ICD-10-CM | POA: Diagnosis not present

## 2015-02-02 DIAGNOSIS — T83711S Erosion of implanted vaginal mesh and other prosthetic materials to surrounding organ or tissue, sequela: Secondary | ICD-10-CM

## 2015-02-02 DIAGNOSIS — Y929 Unspecified place or not applicable: Secondary | ICD-10-CM | POA: Diagnosis not present

## 2015-02-02 DIAGNOSIS — G20 Parkinson's disease: Secondary | ICD-10-CM | POA: Diagnosis not present

## 2015-02-02 DIAGNOSIS — Z79891 Long term (current) use of opiate analgesic: Secondary | ICD-10-CM | POA: Diagnosis not present

## 2015-02-02 DIAGNOSIS — M797 Fibromyalgia: Secondary | ICD-10-CM | POA: Diagnosis not present

## 2015-02-02 DIAGNOSIS — Z79899 Other long term (current) drug therapy: Secondary | ICD-10-CM | POA: Insufficient documentation

## 2015-02-02 DIAGNOSIS — F0390 Unspecified dementia without behavioral disturbance: Secondary | ICD-10-CM | POA: Diagnosis not present

## 2015-02-02 DIAGNOSIS — H409 Unspecified glaucoma: Secondary | ICD-10-CM | POA: Insufficient documentation

## 2015-02-02 DIAGNOSIS — Y838 Other surgical procedures as the cause of abnormal reaction of the patient, or of later complication, without mention of misadventure at the time of the procedure: Secondary | ICD-10-CM | POA: Insufficient documentation

## 2015-02-02 DIAGNOSIS — Z90710 Acquired absence of both cervix and uterus: Secondary | ICD-10-CM | POA: Diagnosis not present

## 2015-02-02 DIAGNOSIS — K219 Gastro-esophageal reflux disease without esophagitis: Secondary | ICD-10-CM | POA: Diagnosis not present

## 2015-02-02 DIAGNOSIS — E119 Type 2 diabetes mellitus without complications: Secondary | ICD-10-CM | POA: Diagnosis not present

## 2015-02-02 DIAGNOSIS — I10 Essential (primary) hypertension: Secondary | ICD-10-CM | POA: Insufficient documentation

## 2015-02-02 DIAGNOSIS — N939 Abnormal uterine and vaginal bleeding, unspecified: Secondary | ICD-10-CM | POA: Diagnosis present

## 2015-02-02 DIAGNOSIS — N390 Urinary tract infection, site not specified: Secondary | ICD-10-CM | POA: Insufficient documentation

## 2015-02-02 HISTORY — PX: PUBOVAGINAL SLING: SHX1035

## 2015-02-02 LAB — GLUCOSE, CAPILLARY
Glucose-Capillary: 161 mg/dL — ABNORMAL HIGH (ref 70–99)
Glucose-Capillary: 180 mg/dL — ABNORMAL HIGH (ref 70–99)

## 2015-02-02 LAB — URINE MICROSCOPIC-ADD ON

## 2015-02-02 LAB — URINALYSIS, ROUTINE W REFLEX MICROSCOPIC
BILIRUBIN URINE: NEGATIVE
GLUCOSE, UA: NEGATIVE mg/dL
KETONES UR: NEGATIVE mg/dL
NITRITE: NEGATIVE
Protein, ur: >300 mg/dL — AB
SPECIFIC GRAVITY, URINE: 1.016 (ref 1.005–1.030)
Urobilinogen, UA: 0.2 mg/dL (ref 0.0–1.0)
pH: 6 (ref 5.0–8.0)

## 2015-02-02 SURGERY — CREATION, PUBOVAGINAL SLING
Anesthesia: General

## 2015-02-02 MED ORDER — LACTATED RINGERS IV SOLN
INTRAVENOUS | Status: DC
  Administered 2015-02-02: 1000 mL via INTRAVENOUS

## 2015-02-02 MED ORDER — PROMETHAZINE HCL 25 MG/ML IJ SOLN: 6.2500 mg | INTRAMUSCULAR | Status: DC | PRN

## 2015-02-02 MED ORDER — ROCURONIUM BROMIDE 100 MG/10ML IV SOLN
INTRAVENOUS | Status: AC
  Filled 2015-02-02: qty 1

## 2015-02-02 MED ORDER — MEPERIDINE HCL 50 MG/ML IJ SOLN: 6.2500 mg | INTRAMUSCULAR | Status: DC | PRN

## 2015-02-02 MED ORDER — CEFAZOLIN SODIUM-DEXTROSE 2-3 GM-% IV SOLR
INTRAVENOUS | Status: AC
  Filled 2015-02-02: qty 50

## 2015-02-02 MED ORDER — ONDANSETRON HCL 4 MG/2ML IJ SOLN
INTRAMUSCULAR | Status: DC | PRN
  Administered 2015-02-02: 4 mg via INTRAVENOUS

## 2015-02-02 MED ORDER — LACTATED RINGERS IV SOLN
INTRAVENOUS | Status: DC | PRN
  Administered 2015-02-02: 11:00:00 via INTRAVENOUS

## 2015-02-02 MED ORDER — NEOSTIGMINE METHYLSULFATE 10 MG/10ML IV SOLN
INTRAVENOUS | Status: AC
  Filled 2015-02-02: qty 1

## 2015-02-02 MED ORDER — GLYCOPYRROLATE 0.2 MG/ML IJ SOLN
INTRAMUSCULAR | Status: AC
  Filled 2015-02-02: qty 2

## 2015-02-02 MED ORDER — LIDOCAINE HCL (CARDIAC) 20 MG/ML IV SOLN
INTRAVENOUS | Status: AC
  Filled 2015-02-02: qty 15

## 2015-02-02 MED ORDER — STERILE WATER FOR IRRIGATION IR SOLN
Status: DC | PRN
  Administered 2015-02-02: 500 mL

## 2015-02-02 MED ORDER — ONDANSETRON HCL 4 MG/2ML IJ SOLN
INTRAMUSCULAR | Status: AC
  Filled 2015-02-02: qty 2

## 2015-02-02 MED ORDER — LIDOCAINE HCL (CARDIAC) 20 MG/ML IV SOLN
INTRAVENOUS | Status: DC | PRN
  Administered 2015-02-02: 50 mg via INTRAVENOUS

## 2015-02-02 MED ORDER — BUPIVACAINE-EPINEPHRINE (PF) 0.25% -1:200000 IJ SOLN
INTRAMUSCULAR | Status: AC
  Filled 2015-02-02: qty 30

## 2015-02-02 MED ORDER — CEFAZOLIN SODIUM-DEXTROSE 2-3 GM-% IV SOLR
2.0000 g | INTRAVENOUS | Status: AC
  Administered 2015-02-02: 2 g via INTRAVENOUS

## 2015-02-02 MED ORDER — PROPOFOL 10 MG/ML IV BOLUS
INTRAVENOUS | Status: DC | PRN
  Administered 2015-02-02: 150 mg via INTRAVENOUS

## 2015-02-02 MED ORDER — FENTANYL CITRATE 0.05 MG/ML IJ SOLN
INTRAMUSCULAR | Status: DC | PRN
  Administered 2015-02-02 (×2): 25 ug via INTRAVENOUS
  Administered 2015-02-02: 50 ug via INTRAVENOUS

## 2015-02-02 MED ORDER — FENTANYL CITRATE 0.05 MG/ML IJ SOLN
INTRAMUSCULAR | Status: AC
  Filled 2015-02-02: qty 2

## 2015-02-02 MED ORDER — PROPOFOL 10 MG/ML IV BOLUS
INTRAVENOUS | Status: AC
  Filled 2015-02-02: qty 20

## 2015-02-02 MED ORDER — FENTANYL CITRATE 0.05 MG/ML IJ SOLN: 25.0000 ug | INTRAMUSCULAR | Status: DC | PRN

## 2015-02-02 SURGICAL SUPPLY — 41 items
BAG DECANTER FOR FLEXI CONT (MISCELLANEOUS) IMPLANT
BAG URINE DRAINAGE (UROLOGICAL SUPPLIES) ×3 IMPLANT
BLADE HEX COATED 2.75 (ELECTRODE) IMPLANT
BLADE SURG 15 STRL LF DISP TIS (BLADE) ×1 IMPLANT
BLADE SURG 15 STRL SS (BLADE) ×2
BLADE SURG SZ10 CARB STEEL (BLADE) ×3 IMPLANT
CATH FOLEY 2WAY SLVR  5CC 18FR (CATHETERS) ×2
CATH FOLEY 2WAY SLVR 5CC 18FR (CATHETERS) ×1 IMPLANT
COVER SURGICAL LIGHT HANDLE (MISCELLANEOUS) ×3 IMPLANT
DECANTER SPIKE VIAL GLASS SM (MISCELLANEOUS) IMPLANT
DRAIN PENROSE 18X1/2 LTX STRL (DRAIN) IMPLANT
DRAPE CAMERA CLOSED 9X96 (DRAPES) IMPLANT
DRAPE UTILITY 15X26 (DRAPE) ×3 IMPLANT
ELECT REM PT RETURN 9FT ADLT (ELECTROSURGICAL) ×3
ELECTRODE REM PT RTRN 9FT ADLT (ELECTROSURGICAL) ×1 IMPLANT
GLOVE BIOGEL M STRL SZ7.5 (GLOVE) ×6 IMPLANT
GOWN STRL REUS W/TWL XL LVL3 (GOWN DISPOSABLE) ×6 IMPLANT
KIT BASIN OR (CUSTOM PROCEDURE TRAY) ×3 IMPLANT
KIT SUPRAPUBIC CATH (MISCELLANEOUS) IMPLANT
LIQUID BAND (GAUZE/BANDAGES/DRESSINGS) IMPLANT
NEEDLE HYPO 22GX1.5 SAFETY (NEEDLE) ×3 IMPLANT
NS IRRIG 1000ML POUR BTL (IV SOLUTION) IMPLANT
PACK CYSTO (CUSTOM PROCEDURE TRAY) ×3 IMPLANT
PACKING VAGINAL (PACKING) IMPLANT
PENCIL BUTTON HOLSTER BLD 10FT (ELECTRODE) IMPLANT
PLUG CATH AND CAP STER (CATHETERS) ×9 IMPLANT
RETRACTOR LONRSTAR 16.6X16.6CM (MISCELLANEOUS) ×1 IMPLANT
RETRACTOR STAY HOOK 5MM (MISCELLANEOUS) ×3 IMPLANT
RETRACTOR STER APS 16.6X16.6CM (MISCELLANEOUS) ×3
SCRUB PCMX 4 OZ (MISCELLANEOUS) IMPLANT
SHEET LAVH (DRAPES) ×3 IMPLANT
SPONGE LAP 4X18 X RAY DECT (DISPOSABLE) IMPLANT
SUCTION FRAZIER 12FR DISP (SUCTIONS) ×3 IMPLANT
SUT VIC AB 2-0 UR5 27 (SUTURE) IMPLANT
SUT VIC AB 2-0 UR6 27 (SUTURE) IMPLANT
SYRINGE 10CC LL (SYRINGE) ×3 IMPLANT
TOWEL OR NON WOVEN STRL DISP B (DISPOSABLE) ×3 IMPLANT
TUBING CONNECTING 10 (TUBING) ×2 IMPLANT
TUBING CONNECTING 10' (TUBING) ×1
WATER STERILE IRR 1500ML POUR (IV SOLUTION) IMPLANT
YANKAUER SUCT BULB TIP 10FT TU (MISCELLANEOUS) ×3 IMPLANT

## 2015-02-02 NOTE — Progress Notes (Signed)
[facility] [place]

## 2015-02-02 NOTE — Transfer of Care (Signed)
Immediate Anesthesia Transfer of Care Note  Patient: Cristina Farrell  Procedure(s) Performed: Procedure(s) (LRB): exam under anesthesia, excision of exposed vaginal suture, cystoscopy (N/A)  Patient Location: PACU  Anesthesia Type: General  Level of Consciousness: sedated, patient cooperative and responds to stimulation  Airway & Oxygen Therapy: Patient Spontanous Breathing and Patient connected to face mask oxgen  Post-op Assessment: Report given to PACU RN and Post -op Vital signs reviewed and stable  Post vital signs: Reviewed and stable  Complications: No apparent anesthesia complications

## 2015-02-02 NOTE — Anesthesia Preprocedure Evaluation (Signed)
Anesthesia Evaluation  Patient identified by MRN, date of birth, ID band Patient awake    Reviewed: Allergy & Precautions, NPO status , Patient's Chart, lab work & pertinent test results  Airway Mallampati: II  TM Distance: >3 FB Neck ROM: Full    Dental no notable dental hx.    Pulmonary neg pulmonary ROS,  breath sounds clear to auscultation  Pulmonary exam normal       Cardiovascular hypertension, Pt. on medications + CAD Rhythm:Regular Rate:Normal     Neuro/Psych Parkinson's Dementia TIA's TIAnegative psych ROS   GI/Hepatic negative GI ROS, Neg liver ROS,   Endo/Other  diabetes, Type 2  Renal/GU negative Renal ROS  negative genitourinary   Musculoskeletal  (+) Fibromyalgia -  Abdominal   Peds negative pediatric ROS (+)  Hematology negative hematology ROS (+)   Anesthesia Other Findings   Reproductive/Obstetrics negative OB ROS                             Anesthesia Physical Anesthesia Plan  ASA: III  Anesthesia Plan: General   Post-op Pain Management:    Induction: Intravenous  Airway Management Planned: LMA  Additional Equipment:   Intra-op Plan:   Post-operative Plan: Extubation in OR  Informed Consent: I have reviewed the patients History and Physical, chart, labs and discussed the procedure including the risks, benefits and alternatives for the proposed anesthesia with the patient or authorized representative who has indicated his/her understanding and acceptance.   Dental advisory given  Plan Discussed with: CRNA  Anesthesia Plan Comments:         Anesthesia Quick Evaluation

## 2015-02-02 NOTE — Anesthesia Postprocedure Evaluation (Signed)
  Anesthesia Post-op Note  Patient: Cristina Farrell  Procedure(s) Performed: Procedure(s) (LRB): exam under anesthesia, excision of exposed vaginal suture, cystoscopy (N/A)  Patient Location: PACU  Anesthesia Type: General  Level of Consciousness: awake and alert   Airway and Oxygen Therapy: Patient Spontanous Breathing  Post-op Pain: mild  Post-op Assessment: Post-op Vital signs reviewed, Patient's Cardiovascular Status Stable, Respiratory Function Stable, Patent Airway and No signs of Nausea or vomiting  Last Vitals:  Filed Vitals:   02/02/15 1333  BP: 139/62  Pulse: 90  Temp:   Resp: 20    Post-op Vital Signs: stable   Complications: No apparent anesthesia complications

## 2015-02-02 NOTE — Interval H&P Note (Signed)
History and Physical Interval Note:  02/02/2015 10:24 AM  Cristina Farrell  has presented today for surgery, with the diagnosis of PUBOVAGINAL SLING SUTURE EROSION  The various methods of treatment have been discussed with the patient and family. After consideration of risks, benefits and other options for treatment, the patient has consented to  Procedure(s): PUBO-VAGINAL SLING EXCISION OF VAGINAL SUTURES  (N/A) as a surgical intervention .  The patient's history has been reviewed, patient examined, no change in status, stable for surgery.  I have reviewed the patient's chart and labs.  Questions were answered to the patient's satisfaction.     Jareth Pardee I Rogan Ecklund

## 2015-02-02 NOTE — Op Note (Signed)
Pre-operative diagnosis :  Vaginal suture erosion post vaginal mesh exposure excision  Postoperative diagnosis:  Same plus UTI  Operation:  Vaginal examination Under anesthesia, removal of bilateral vaginal suture exposures; Cystoscopy  Surgeon:  S. Patsi Searsannenbaum, MD  First assistant: none  Anesthesia:  General LMA  Preparation:  After appropriate preanesthesia, the patient was brought to the operative room, placed on the operating table in the dorsal supine position where general LMA anesthesia was introduced. She was then replaced in the dorsal lithotomy position with pubis was prepped with Betadine solution and draped in usual fashion. 18 French Foley catheter was placed and plugged. Retractor was placed. A protected posterior weighted speculum was placed. The history was reviewed. The armband was double checked.  Review history:  History of Present Illness Mrs Cristina Farrell is and 81 yo female, resident of [facility] in [place], now 20+ years post pubovaginal sling. She has developed vaginal bleeding, and has been found by Dr. Leota JacobsenGaccione to have vaginal suture erosion, and referred for Rx. she is c/o suprapubic pain.. Normal bowel movements. urine control: sometimes she has incontinence without awareness. She is post removal of transurethral mesh exposure,  But now complains of suture exposure, and is now for vaginal exam under anesthesia and exposed suture removal.   Past Medical History Problems  1. History of Dementia (F03.90) 2. History of arthritis (Z87.39) 3. History of esophageal reflux (Z87.19) 4. History of fibromyalgia (Z87.39) 5. History of glaucoma (Z86.69) 6. History of hypertension (Z86.79) 7. History of hypothyroidism (Z86.39) 8. History of Parkinson disease, symptomatic (G20)  Statement of  Likelihood of Success: Excellent. TIME-OUT observed.:  Procedure:  Vaginal expiration revealed to permanent sutures identified and exposed on the right side in the area 1 cm  to 27 m proximal to the urethra, and a third exposed suture was identified on the left side directly across from these sutures. Pulling on the sutures did not remove the suture. Therefore, I was able to cut the suture at the level of the vagina, and there was no bleeding noted. Examination revealed smooth vaginal wall.  The plug was removed and the Foley catheter, and the patient was noted to have white creamy foam from her catheter. I elected to take this fluid and sent it for urinalysis and culture. I then elected to perform cystoscopy on the patient, in order to rule out bladder mesh erosion.  Cystoscopy was accomplished with both the 30 lens and the 70 lens. The bladder was irrigated free from irritative white flaky tissue. There was no evidence of any mesh erosion. The bladder wall was pink and soft. The trigone was brought. Clear reflux was seen from both orifices. There were no masses, no stones.  The bladder was drained of fluid. The patient was awakened and taken to recovery room in good condition.

## 2015-02-03 ENCOUNTER — Encounter (HOSPITAL_COMMUNITY): Payer: Self-pay | Admitting: Urology

## 2015-02-04 LAB — URINE CULTURE

## 2017-01-17 DIAGNOSIS — I10 Essential (primary) hypertension: Secondary | ICD-10-CM | POA: Diagnosis not present

## 2017-01-17 DIAGNOSIS — F0390 Unspecified dementia without behavioral disturbance: Secondary | ICD-10-CM | POA: Diagnosis not present

## 2017-01-17 DIAGNOSIS — N390 Urinary tract infection, site not specified: Secondary | ICD-10-CM

## 2017-01-17 DIAGNOSIS — R4182 Altered mental status, unspecified: Secondary | ICD-10-CM | POA: Diagnosis not present

## 2017-01-17 DIAGNOSIS — E119 Type 2 diabetes mellitus without complications: Secondary | ICD-10-CM | POA: Diagnosis not present

## 2017-01-17 DIAGNOSIS — E039 Hypothyroidism, unspecified: Secondary | ICD-10-CM | POA: Diagnosis not present

## 2017-01-18 DIAGNOSIS — R4182 Altered mental status, unspecified: Secondary | ICD-10-CM | POA: Diagnosis not present

## 2017-01-18 DIAGNOSIS — N390 Urinary tract infection, site not specified: Secondary | ICD-10-CM | POA: Diagnosis not present

## 2017-01-18 DIAGNOSIS — E119 Type 2 diabetes mellitus without complications: Secondary | ICD-10-CM | POA: Diagnosis not present

## 2017-01-19 DIAGNOSIS — N390 Urinary tract infection, site not specified: Secondary | ICD-10-CM | POA: Diagnosis not present

## 2017-01-19 DIAGNOSIS — E119 Type 2 diabetes mellitus without complications: Secondary | ICD-10-CM | POA: Diagnosis not present

## 2017-01-19 DIAGNOSIS — R4182 Altered mental status, unspecified: Secondary | ICD-10-CM | POA: Diagnosis not present

## 2019-06-22 DEATH — deceased
